# Patient Record
Sex: Male | Born: 1995 | Race: White | Hispanic: No | Marital: Single
Health system: Southern US, Community
[De-identification: ages and names within clinical notes are randomized; demographics above are authoritative.]

## PROBLEM LIST (undated history)

## (undated) HISTORY — PX: NO PAST SURGERIES: SHX2092

---
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
## 2019-01-20 ENCOUNTER — Ambulatory Visit: Payer: Self-pay

## 2019-01-20 ENCOUNTER — Ambulatory Visit (LOCAL_COMMUNITY_HEALTH_CENTER): Payer: Self-pay

## 2019-01-20 ENCOUNTER — Other Ambulatory Visit: Payer: Self-pay

## 2019-01-20 DIAGNOSIS — Z0184 Encounter for antibody response examination: Secondary | ICD-10-CM | POA: Insufficient documentation

## 2019-01-20 NOTE — Progress Notes (Signed)
Pt at [facility] for quantitative Hep B titer. Pt had Hep B booster 11/10/2018. Pt states he was told by [facility] to have titer done after 01/10/2019. Pt signed ROI to get results. Pt sent to lab for bloodwork.

## 2019-01-21 LAB — HEPATITIS B SURFACE ANTIBODY, QUANTITATIVE: Hepatitis B Surf Ab Quant: 10.5 m[IU]/mL (ref >9.9)

## 2019-03-23 NOTE — Addendum Note (Signed)
Addended by: Cletis [employer] on: 03/23/2019 03:31 PM   Modules accepted: Orders
